# Patient Record
Sex: Female | Born: 1990 | Race: White | Hispanic: No | Marital: Single | Smoking: Never smoker
Health system: Southern US, Community
[De-identification: ages and names within clinical notes are randomized; demographics above are authoritative.]

---
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
## 2008-07-01 ENCOUNTER — Other Ambulatory Visit: Admission: RE | Admit: 2008-07-01 | Discharge: 2008-07-01 | Payer: Self-pay | Admitting: Obstetrics & Gynecology

## 2008-12-09 ENCOUNTER — Emergency Department (HOSPITAL_COMMUNITY): Admission: EM | Admit: 2008-12-09 | Discharge: 2008-12-09 | Payer: Self-pay | Admitting: Emergency Medicine

## 2010-01-30 IMAGING — CR DG CHEST 2V
2 series · 2 of 2 positions shown · non-contrast
Comparison: None.

CLINICAL DATA: 18-year-old female with chest congestion and fever.
Cough with body aches.

CHEST - 2 VIEW

[w chest pa]
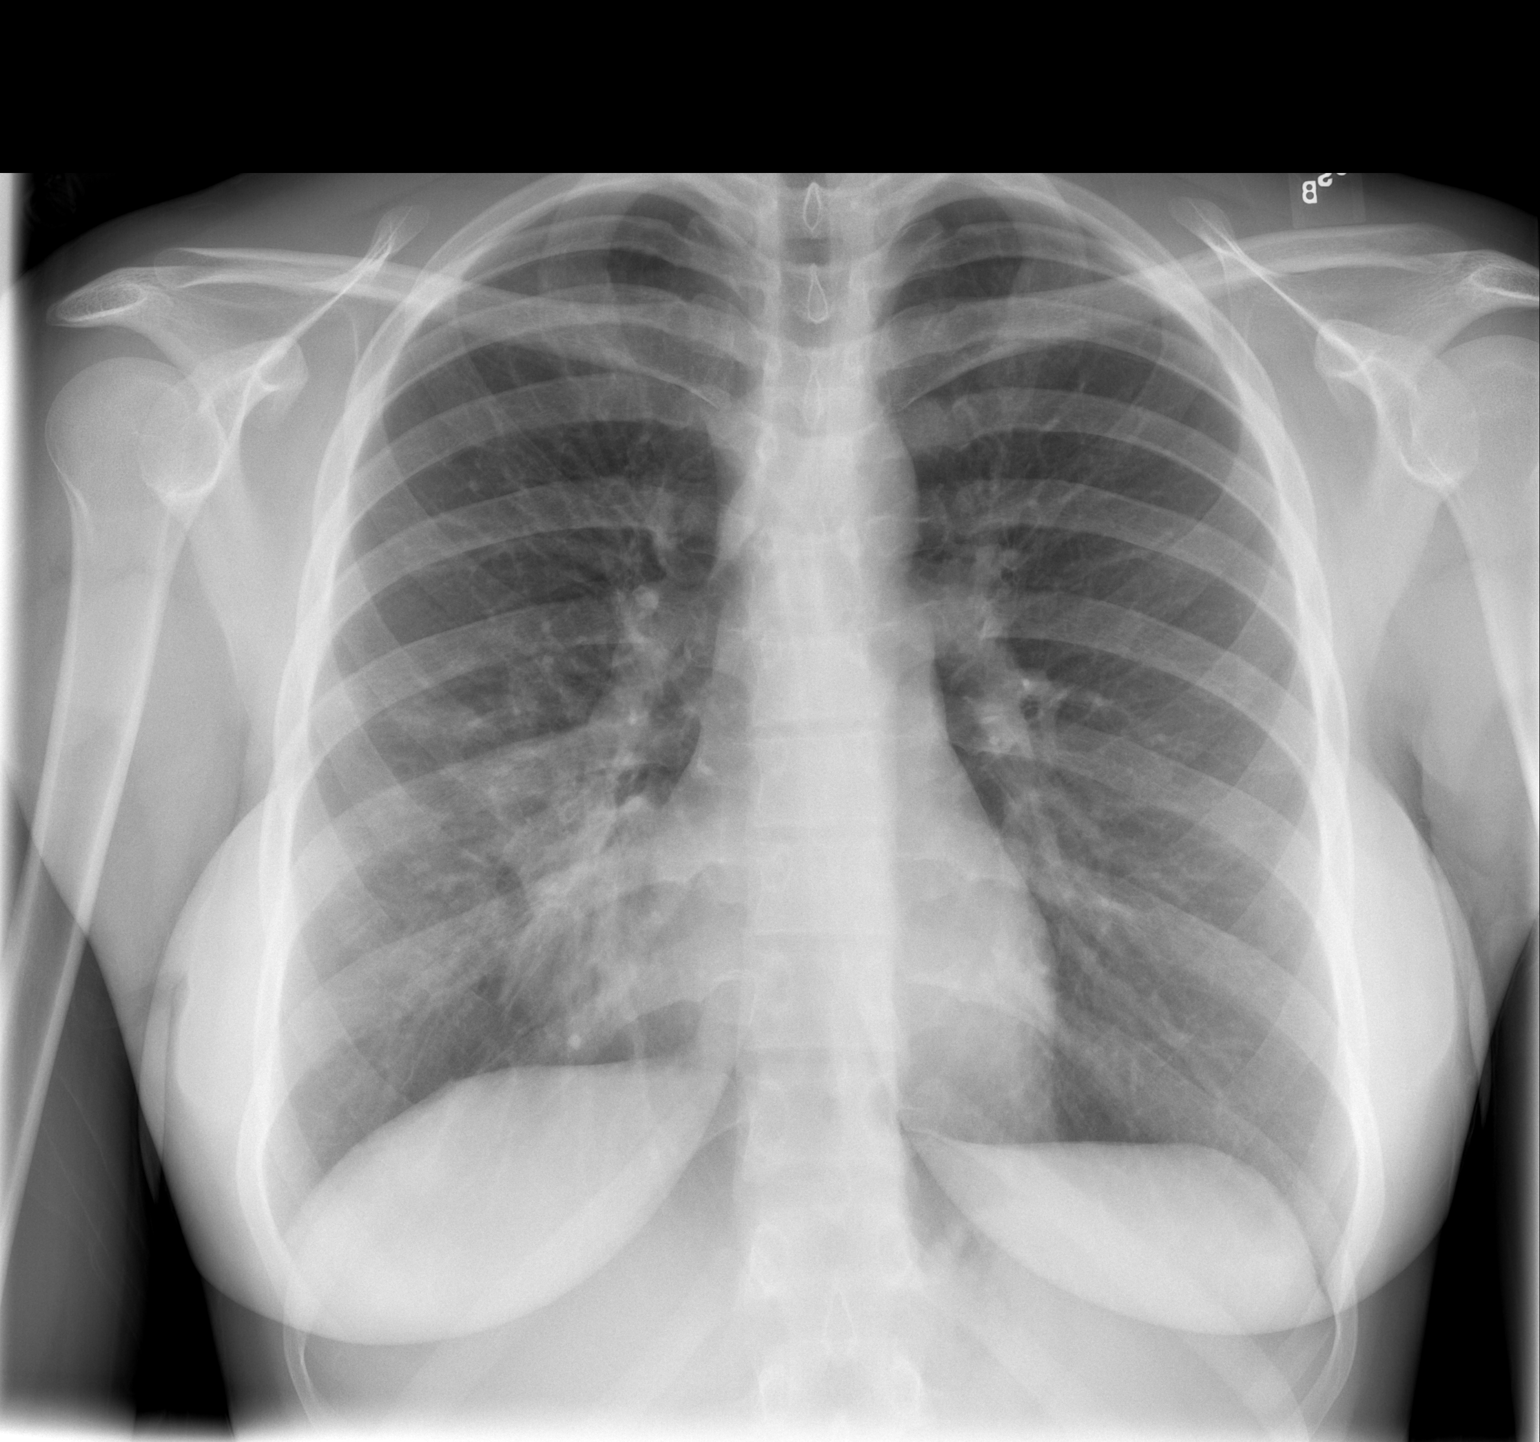

[w chest lat]
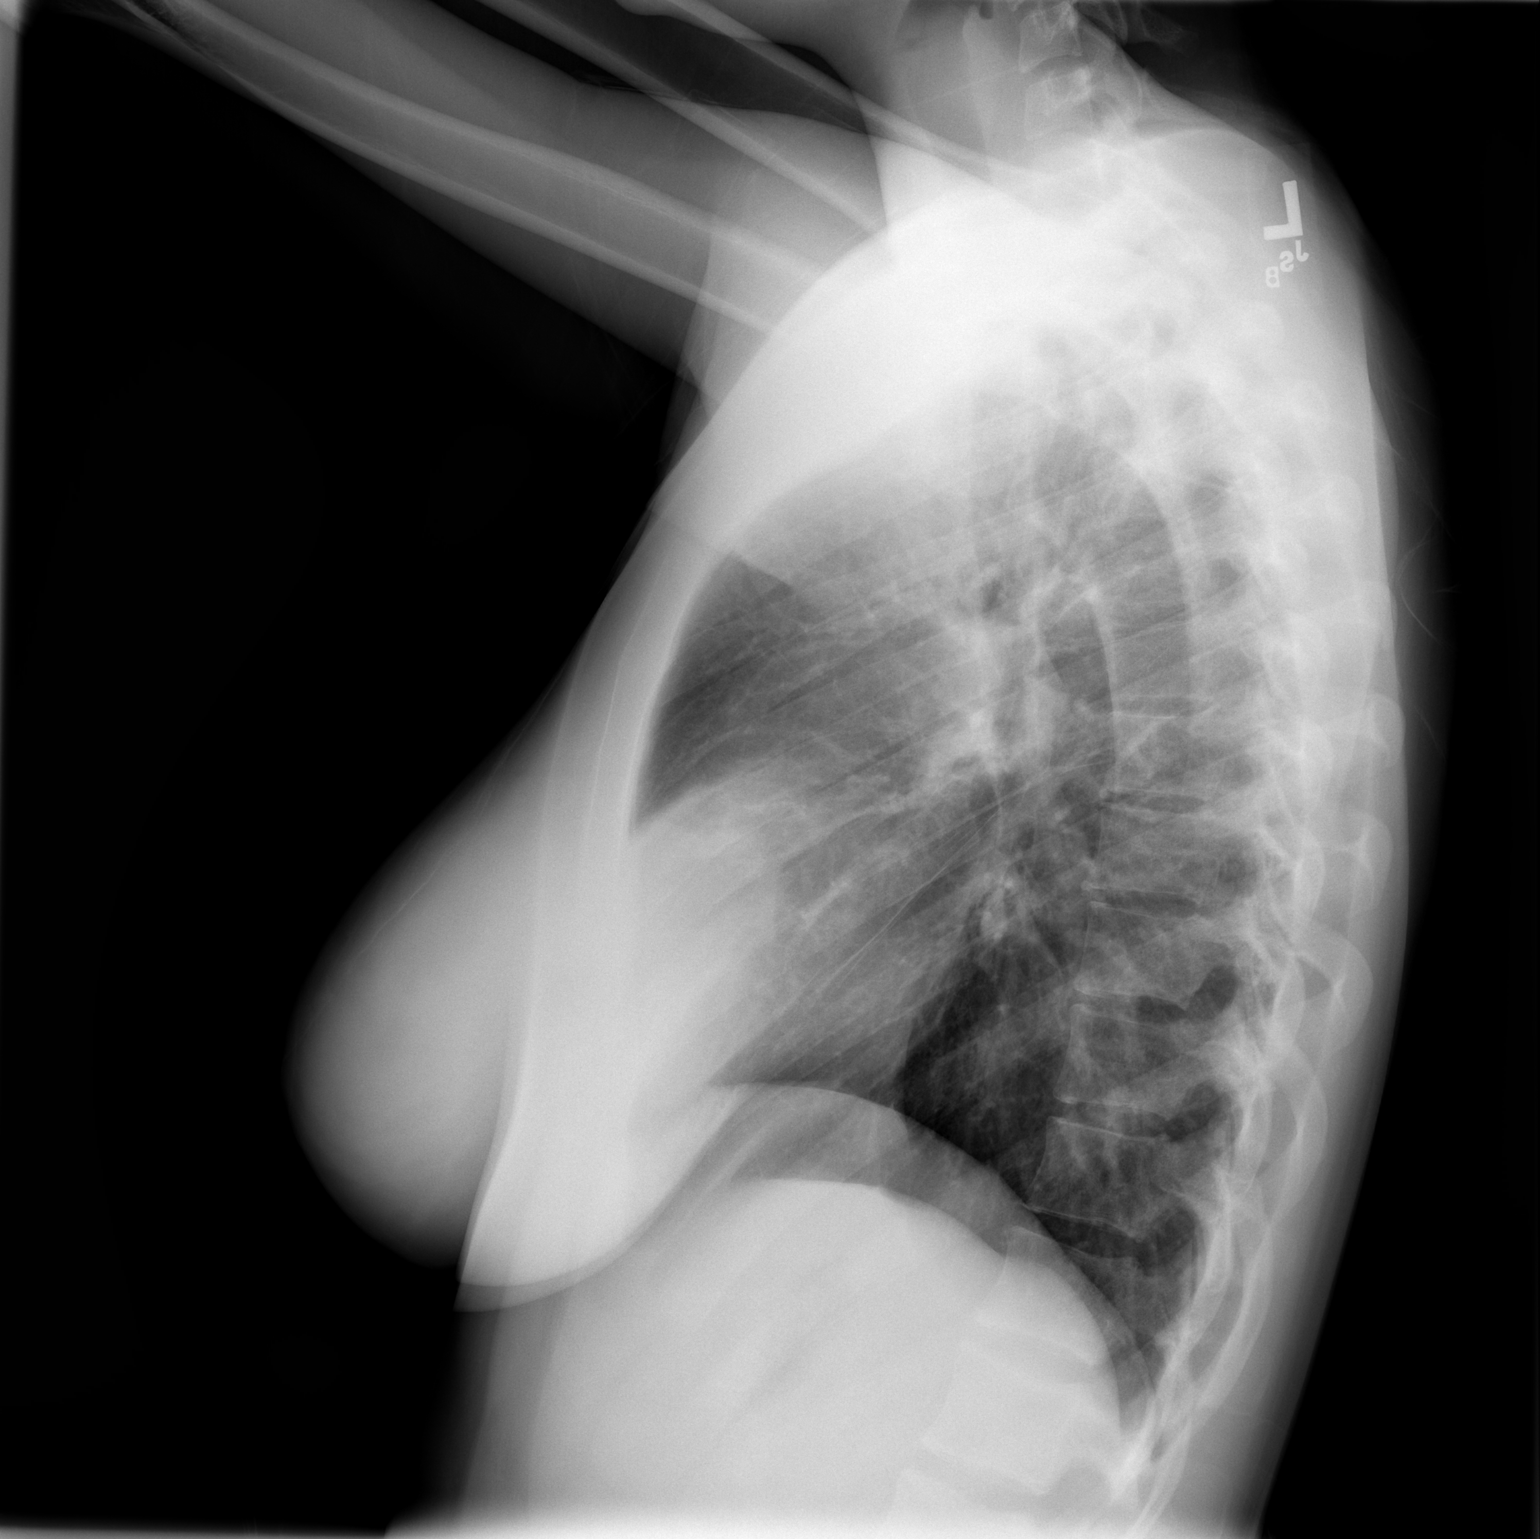

[2 of 2 positions shown; findings below may reference images not displayed]

FINDINGS: Airspace consolidation in the medial segment of the right
middle lobe with some air bronchograms.

 Cardiac size and mediastinal contours are within normal limits.
Elsewhere, the lungs are clear.  No pleural effusion. Visualized
tracheal air column is within normal limits.  No acute osseous
abnormality identified.
IMPRESSION: Medial segment right middle lobe pneumonia.

## 2010-06-01 ENCOUNTER — Ambulatory Visit: Payer: Self-pay

## 2010-11-25 NOTE — Op Note (Signed)
NAME:  Jeanette Rogers, FORGEY                             ACCOUNT NO.:  [id number]   MEDICAL RECORD NO.:  [id number]                   PATIENT TYPE:  AMB   LOCATION:  DSC                                  FACILITY:  [facility]   PHYSICIAN:  Alfredia Ferguson, M.D.               DATE OF BIRTH:  05/05/1991   DATE OF PROCEDURE:  06/02/2003  DATE OF DISCHARGE:                                 OPERATIVE REPORT   PREOPERATIVE DIAGNOSIS:  Halo nevus, right cheek, pigmented portion, 3 mm,  halo extending 5 mm beyond pigmentation.   POSTOPERATIVE DIAGNOSIS:  Halo nevus, right cheek, pigmented portion, 3 mm,  halo extending 5 mm beyond pigmentation.   OPERATION PERFORMED:  Excision pigmented nevus, right cheek with  approximately 2 to 3 mm margins.   SURGEON:  Alfredia Ferguson, M.D.   ANESTHESIA:  2% Xylocaine 1:100,000 epinephrine.   INDICATIONS FOR PROCEDURE:  This is a 20 year old female who has a halo  nevus on her right cheek.  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  No evidence of melanoma.  After consulting with the  pathologist and with one of the dermatologists at [facility],  the recommendation was made to re-excise the pigmented portion of the halo  nevus with 2 to 3 mm margins.  This will not completely excise the halo but  hopefully this will clear the margins.  The risks of possible margins with  the need for secondary surgery were discussed with the mother.  In spite of  that, they wish to proceed.  The child and the mother understand that they  will be trading what they have for permanent and potentially unsightly scar  on the right cheek.   DESCRIPTION OF PROCEDURE:  Skin markers were placed in elliptical fashion  around the pigmented portion of the lesion with approximately 2 to 3 mm  margins.  Local anesthesia using 2% Xylocaine 1:100,000 epinephrine was  infiltrated.  After waiting 10 minutes, the cheek was prepped with Betadine  and draped with sterile  drapes.  An elliptical excision of the lesion down  to the level of the subcutaneous tissue was carried out.  Specimen was  passed off for pathology.  Wound edges were undermined for a distance of  several millimeters in all directions.  Hemostasis was accomplished using  pressure.  The wound was closed by approximating the dermis using  interrupted 5-0 Monocryl suture.  The skin was united using a running 6-0  nylon suture.  A light dressing was applied and the patient was discharged  to home in the care of her mother.                                               W.  Delia Chimes, M.D.   WBB/MEDQ  D:  06/02/2003  T:  06/02/2003  Job:  [id number]   cc:   Norval Gable. [place], M.D.  [address] [place]  [place] [address]  Fax: [phone]

## 2011-09-06 ENCOUNTER — Ambulatory Visit (INDEPENDENT_AMBULATORY_CARE_PROVIDER_SITE_OTHER): Payer: Self-pay

## 2011-09-06 DIAGNOSIS — F4320 Adjustment disorder, unspecified: Secondary | ICD-10-CM

## 2011-09-13 ENCOUNTER — Ambulatory Visit (INDEPENDENT_AMBULATORY_CARE_PROVIDER_SITE_OTHER): Payer: Self-pay

## 2011-09-13 DIAGNOSIS — F4320 Adjustment disorder, unspecified: Secondary | ICD-10-CM

## 2011-11-01 ENCOUNTER — Ambulatory Visit (INDEPENDENT_AMBULATORY_CARE_PROVIDER_SITE_OTHER): Payer: Commercial Managed Care - PPO | Admitting: Physician Assistant

## 2011-11-01 ENCOUNTER — Encounter: Payer: Self-pay | Admitting: Physician Assistant

## 2011-11-01 VITALS — BP 110/80 | HR 98 | Temp 99.1°F | Resp 16 | Ht 66.0 in | Wt 148.0 lb

## 2011-11-01 DIAGNOSIS — Z13220 Encounter for screening for lipoid disorders: Secondary | ICD-10-CM

## 2011-11-01 DIAGNOSIS — F988 Other specified behavioral and emotional disorders with onset usually occurring in childhood and adolescence: Secondary | ICD-10-CM | POA: Insufficient documentation

## 2011-11-01 DIAGNOSIS — Z111 Encounter for screening for respiratory tuberculosis: Secondary | ICD-10-CM

## 2011-11-01 DIAGNOSIS — Z131 Encounter for screening for diabetes mellitus: Secondary | ICD-10-CM

## 2011-11-01 LAB — POCT URINALYSIS DIPSTICK
Bilirubin, UA: NEGATIVE
Ketones, UA: NEGATIVE
Leukocytes, UA: NEGATIVE
Protein, UA: NEGATIVE
Spec Grav, UA: 1.015
pH, UA: 6

## 2011-11-01 LAB — COMPREHENSIVE METABOLIC PANEL
BUN: 12 mg/dL (ref 6–23)
CO2: 28 mEq/L (ref 19–32)
Calcium: 9.1 mg/dL (ref 8.4–10.5)
Chloride: 108 mEq/L (ref 96–112)
Creat: 0.83 mg/dL (ref 0.50–1.10)
Glucose, Bld: 95 mg/dL (ref 70–99)
Total Bilirubin: 0.5 mg/dL (ref 0.3–1.2)

## 2011-11-01 LAB — LIPID PANEL
Cholesterol: 159 mg/dL (ref 0–200)
HDL: 67 mg/dL (ref >39)
Total CHOL/HDL Ratio: 2.4 Ratio
Triglycerides: 86 mg/dL (ref <150)
VLDL: 17 mg/dL (ref 0–40)

## 2011-11-01 MED ORDER — AMPHETAMINE-DEXTROAMPHETAMINE 10 MG PO TABS: 10.0000 mg | ORAL_TABLET | Freq: Every day | ORAL | Status: DC

## 2011-11-01 MED ORDER — AMPHETAMINE-DEXTROAMPHETAMINE 10 MG PO TABS: ORAL_TABLET | ORAL | Status: AC

## 2011-11-01 NOTE — Progress Notes (Signed)
  Subjective:    Patient ID: Manus Rudd, female    DOB: 1990/09/23, 21 y.o.   MRN: [id number]  HPI Pt here for check up and ADD meds.  She is on Adderall 10mg  and only takes when she is studying/in school.  Needs form filled out for school.  Weight and appetite are stable.  She doesn't have her immunization record with her but does have a form that shows she needs a TB skin test. She had a CPE and normal pap smear September 2012. Review of Systems  All other systems reviewed and are negative.       Objective:   Physical Exam  Nursing note and vitals reviewed. Constitutional: She is oriented to person, place, and time. She appears well-developed and well-nourished.  HENT:  Head: Normocephalic and atraumatic.  Right Ear: External ear normal.  Left Ear: External ear normal.  Nose: Nose normal.  Mouth/Throat: Oropharynx is clear and moist. No oropharyngeal exudate.  Eyes: Conjunctivae and EOM are normal. Pupils are equal, round, and reactive to light.  Neck: Normal range of motion. Neck supple. No thyromegaly present.  Cardiovascular: Normal rate, regular rhythm, normal heart sounds and intact distal pulses.  Exam reveals no gallop and no friction rub.   No murmur heard. Pulmonary/Chest: Effort normal and breath sounds normal.  Abdominal: Soft. Bowel sounds are normal.  Lymphadenopathy:    She has no cervical adenopathy.  Neurological: She is alert and oriented to person, place, and time.  Skin: Skin is warm and dry.  Psychiatric: She has a normal mood and affect. Her behavior is normal.    Results for orders placed in visit on 11/01/11  POCT URINALYSIS DIPSTICK      Component Value Range   Color, UA yellow     Clarity, UA clear     Glucose, UA neg     Bilirubin, UA neg     Ketones, UA neg     Spec Grav, UA 1.015     Blood, UA trace     pH, UA 6.0     Protein, UA neg     Urobilinogen, UA 0.2     Nitrite, UA neg     Leukocytes, UA Negative           Assessment & Plan:    ADD-stable.  Rxs printed for 4/24, 5/24, 6/24. OK to call for next 3 months Rx on adderall (July, august, September) Form filled out.  Patient needs to obtain immunization record.  TB test applied. Check blood work.

## 2011-11-03 ENCOUNTER — Encounter (INDEPENDENT_AMBULATORY_CARE_PROVIDER_SITE_OTHER): Payer: Commercial Managed Care - PPO

## 2011-11-03 DIAGNOSIS — Z13220 Encounter for screening for lipoid disorders: Secondary | ICD-10-CM

## 2011-11-03 DIAGNOSIS — F988 Other specified behavioral and emotional disorders with onset usually occurring in childhood and adolescence: Secondary | ICD-10-CM

## 2011-11-03 DIAGNOSIS — Z111 Encounter for screening for respiratory tuberculosis: Secondary | ICD-10-CM

## 2011-11-03 DIAGNOSIS — Z131 Encounter for screening for diabetes mellitus: Secondary | ICD-10-CM

## 2011-11-18 ENCOUNTER — Other Ambulatory Visit: Payer: Self-pay | Admitting: Family Medicine

## 2011-11-18 NOTE — Telephone Encounter (Signed)
NEED REFILL ON BCP (NOT SURE WHAT IT IS)   [facility] [place]

## 2012-02-27 ENCOUNTER — Encounter: Payer: Self-pay | Admitting: Physician Assistant

## 2012-03-19 ENCOUNTER — Other Ambulatory Visit: Payer: Self-pay | Admitting: Family Medicine

## 2012-03-19 MED ORDER — ETONOGESTREL-ETHINYL ESTRADIOL 0.12-0.015 MG/24HR VA RING: VAGINAL_RING | VAGINAL | Status: DC

## 2012-06-16 ENCOUNTER — Telehealth: Payer: Self-pay

## 2012-06-16 ENCOUNTER — Other Ambulatory Visit: Payer: Self-pay | Admitting: Physician Assistant

## 2012-06-16 MED ORDER — ETONOGESTREL-ETHINYL ESTRADIOL 0.12-0.015 MG/24HR VA RING: VAGINAL_RING | VAGINAL | Status: DC

## 2012-06-16 NOTE — Telephone Encounter (Signed)
Refill for one month sent in, please remind her that she is due for follow up. She will need an office visit for further refills. She was reminded of this with her last refill in September also.

## 2012-06-16 NOTE — Telephone Encounter (Signed)
Spoke with pt, had to resend Rx to [facility] because [facility]. Advised pt to RTC to get additional refills. Pt understood.

## 2012-06-16 NOTE — Telephone Encounter (Signed)
Pt is trying to get a refill on her birthcontrol-states [facility] uses [facility] and they state she is out of refills   Best number [phone]

## 2012-08-25 ENCOUNTER — Telehealth: Payer: Self-pay

## 2012-08-25 NOTE — Telephone Encounter (Signed)
Patient would like refill of birth control. She knows she is due for a visit but is in school and will not be back here until spring break so would like something to hold her over. Also, [facility] uses [facility] and doesn't know if she needs to call them for a request or if we can just send it directly to them.  Best [phone]

## 2012-08-26 ENCOUNTER — Other Ambulatory Visit: Payer: Self-pay | Admitting: Radiology

## 2012-08-26 MED ORDER — ETONOGESTREL-ETHINYL ESTRADIOL 0.12-0.015 MG/24HR VA RING: VAGINAL_RING | VAGINAL | Status: DC

## 2012-08-26 NOTE — Telephone Encounter (Signed)
Called patient to advise. Left message for her to call me back  

## 2012-08-26 NOTE — Telephone Encounter (Signed)
Patient now wants this sent to [facility]. I called [facility] to cancel the Rx. Resent to [facility]

## 2012-08-26 NOTE — Telephone Encounter (Signed)
I have sent 2 months worth of nu[facility] to [facility] to last her until spring break when she will come in for a visit.  NO FURTHER REFILLS.  Pt has been notified of need for OV since Sept 2013

## 2012-08-26 NOTE — Telephone Encounter (Signed)
Patient advised, she wanted me to cancel at [facility] and fax to [facility], this is done. She is aware she needs appt for further refills.

## 2012-09-24 ENCOUNTER — Ambulatory Visit (INDEPENDENT_AMBULATORY_CARE_PROVIDER_SITE_OTHER): Payer: Commercial Managed Care - PPO | Admitting: Emergency Medicine

## 2012-09-24 ENCOUNTER — Encounter: Payer: Self-pay | Admitting: Emergency Medicine

## 2012-09-24 VITALS — BP 129/82 | HR 70 | Temp 98.0°F | Resp 16 | Ht 66.0 in | Wt 147.2 lb

## 2012-09-24 DIAGNOSIS — Z3049 Encounter for surveillance of other contraceptives: Secondary | ICD-10-CM

## 2012-09-24 DIAGNOSIS — Z209 Contact with and (suspected) exposure to unspecified communicable disease: Secondary | ICD-10-CM

## 2012-09-24 DIAGNOSIS — Z309 Encounter for contraceptive management, unspecified: Secondary | ICD-10-CM

## 2012-09-24 MED ORDER — ETONOGESTREL-ETHINYL ESTRADIOL 0.12-0.015 MG/24HR VA RING: VAGINAL_RING | VAGINAL | Status: DC

## 2012-09-24 NOTE — Progress Notes (Signed)
  Subjective:    Patient ID: Jeanette Rogers, female    DOB: May 28, 1991, 22 y.o.   MRN: [id number]  HPI patient here for recheck. Of her ongoing contraceptive therapy. She is currently on heparin. He tolerates this without difficulty. Her periods are regular and she has no complaints at this time. She is section in active with the same partner for the last 5 years.    Review of Systems     Objective:   Physical Exam HEENT exam is unremarkable. Her neck is supple. Chest clear to auscultation and percussion. Heart regular rate no murmurs. Abdomen soft nontender. Breast and pelvic exam to be performed by Frances Furbish.        Assessment & Plan:  We'll go ahead and refill NuvaRing.

## 2012-09-24 NOTE — Progress Notes (Signed)
Breast Exam:  Symmetrical, no skin changes, no discharge from the nipple, no masses or nodules  Pelvic Exam:  External genitalia normal, no lesions or rashes.  Menses present on speculum exam, no vaginal or cervical discharge, genprobe collected.  No adnexal or cervical motion tenderness on bimanual.

## 2012-09-25 ENCOUNTER — Ambulatory Visit: Payer: Commercial Managed Care - PPO | Admitting: Physician Assistant

## 2013-04-28 ENCOUNTER — Encounter: Payer: Self-pay | Admitting: *Deleted

## 2013-04-29 ENCOUNTER — Telehealth: Payer: Self-pay

## 2013-04-29 DIAGNOSIS — Z309 Encounter for contraceptive management, unspecified: Secondary | ICD-10-CM

## 2013-04-29 MED ORDER — ETONOGESTREL-ETHINYL ESTRADIOL 0.12-0.015 MG/24HR VA RING: VAGINAL_RING | VAGINAL | Status: AC

## 2013-04-29 NOTE — Telephone Encounter (Signed)
Patient would like [place] to call in Nuvaring to [facility]. Tel. # [phone] Option 2.

## 2013-04-29 NOTE — Telephone Encounter (Signed)
Meds ordered this encounter  Medications  . etonogestrel-ethinyl estradiol (NUVARING) 0.12-0.015 MG/24HR vaginal ring    Sig: Insert vaginally and leave in place for 3 consecutive weeks, then remove for 1 week.    Dispense:  3 each    Refill:  3    Order Specific Question:  Supervising Provider    Answer:  Ethelda Chick [[id number]]

## 2014-03-15 ENCOUNTER — Other Ambulatory Visit: Payer: Self-pay | Admitting: Family Medicine

## 2014-04-05 ENCOUNTER — Other Ambulatory Visit: Payer: Self-pay | Admitting: Family Medicine

## 2014-04-08 ENCOUNTER — Other Ambulatory Visit: Payer: Self-pay

## 2014-04-08 MED ORDER — VALACYCLOVIR HCL 1 G PO TABS: ORAL_TABLET | ORAL | Status: AC

## 2014-04-08 NOTE — Telephone Encounter (Signed)
Pt CB and verified [facility] on [address].

## 2014-04-08 NOTE — Telephone Encounter (Signed)
Pt LM on VM asking for RF of Valtrex, having a flare up. Checked paper chart. Pt has Dx of HSV I and was last Rxd by Dr Audria NineMcPherson in 2012, Valtrex 1 mg, 2 tabs BID x 2 doses prn flare up, #30. Pt realizes she is overdue for f/up and already has appt sch for 04/14/14 but needs RF now. Is this OK? Pt last seen 09/24/2012. I have LMOM for pt to call back to verify which pharm to send to. If you can advise, I will send when pt CB.

## 2014-04-09 NOTE — Telephone Encounter (Signed)
Notified pt on VM Rx was sent.

## 2014-04-14 ENCOUNTER — Ambulatory Visit: Payer: Commercial Managed Care - PPO | Admitting: Family Medicine
